# Patient Record
Sex: Female | Born: 1965 | Race: White | Hispanic: No | Marital: Married | Smoking: Never smoker
Health system: Southern US, Community
[De-identification: ages and names within clinical notes are randomized; demographics above are authoritative.]

## PROBLEM LIST (undated history)

## (undated) DIAGNOSIS — M549 Dorsalgia, unspecified: Secondary | ICD-10-CM

## (undated) DIAGNOSIS — F319 Bipolar disorder, unspecified: Secondary | ICD-10-CM

## (undated) DIAGNOSIS — J45909 Unspecified asthma, uncomplicated: Secondary | ICD-10-CM

## (undated) DIAGNOSIS — E119 Type 2 diabetes mellitus without complications: Secondary | ICD-10-CM

## (undated) HISTORY — PX: KNEE SURGERY: SHX244

---

## 2001-04-09 ENCOUNTER — Other Ambulatory Visit: Admission: RE | Admit: 2001-04-09 | Discharge: 2001-04-09 | Payer: Self-pay | Admitting: Obstetrics and Gynecology

## 2002-04-12 ENCOUNTER — Other Ambulatory Visit: Admission: RE | Admit: 2002-04-12 | Discharge: 2002-04-12 | Payer: Self-pay | Admitting: Obstetrics and Gynecology

## 2003-05-08 ENCOUNTER — Other Ambulatory Visit: Admission: RE | Admit: 2003-05-08 | Discharge: 2003-05-08 | Payer: Self-pay | Admitting: Obstetrics and Gynecology

## 2003-08-30 ENCOUNTER — Emergency Department (HOSPITAL_COMMUNITY): Admission: EM | Admit: 2003-08-30 | Discharge: 2003-08-30 | Payer: Self-pay | Admitting: Emergency Medicine

## 2003-08-30 ENCOUNTER — Emergency Department (HOSPITAL_COMMUNITY): Admission: EM | Admit: 2003-08-30 | Discharge: 2003-08-30 | Payer: Self-pay | Admitting: *Deleted

## 2004-03-26 ENCOUNTER — Emergency Department (HOSPITAL_COMMUNITY): Admission: EM | Admit: 2004-03-26 | Discharge: 2004-03-26 | Payer: Self-pay | Admitting: Emergency Medicine

## 2004-03-26 ENCOUNTER — Ambulatory Visit (HOSPITAL_COMMUNITY): Admission: RE | Admit: 2004-03-26 | Discharge: 2004-03-26 | Payer: Self-pay | Admitting: Internal Medicine

## 2004-05-10 ENCOUNTER — Other Ambulatory Visit: Admission: RE | Admit: 2004-05-10 | Discharge: 2004-05-10 | Payer: Self-pay | Admitting: Obstetrics and Gynecology

## 2004-12-10 ENCOUNTER — Emergency Department (HOSPITAL_COMMUNITY): Admission: EM | Admit: 2004-12-10 | Discharge: 2004-12-11 | Payer: Self-pay | Admitting: Emergency Medicine

## 2004-12-11 ENCOUNTER — Ambulatory Visit: Payer: Self-pay | Admitting: Psychiatry

## 2004-12-11 ENCOUNTER — Inpatient Hospital Stay (HOSPITAL_COMMUNITY): Admission: RE | Admit: 2004-12-11 | Discharge: 2004-12-12 | Payer: Self-pay | Admitting: Psychiatry

## 2004-12-24 ENCOUNTER — Inpatient Hospital Stay (HOSPITAL_COMMUNITY): Admission: RE | Admit: 2004-12-24 | Discharge: 2004-12-27 | Payer: Self-pay | Admitting: Psychiatry

## 2005-01-24 ENCOUNTER — Inpatient Hospital Stay (HOSPITAL_COMMUNITY): Admission: RE | Admit: 2005-01-24 | Discharge: 2005-01-26 | Payer: Self-pay | Admitting: Psychiatry

## 2005-01-24 ENCOUNTER — Ambulatory Visit: Payer: Self-pay | Admitting: Psychiatry

## 2005-03-16 ENCOUNTER — Emergency Department (HOSPITAL_COMMUNITY): Admission: EM | Admit: 2005-03-16 | Discharge: 2005-03-16 | Payer: Self-pay | Admitting: *Deleted

## 2005-03-25 ENCOUNTER — Emergency Department (HOSPITAL_COMMUNITY): Admission: EM | Admit: 2005-03-25 | Discharge: 2005-03-26 | Payer: Self-pay | Admitting: Emergency Medicine

## 2005-03-25 ENCOUNTER — Inpatient Hospital Stay (HOSPITAL_COMMUNITY): Admission: EM | Admit: 2005-03-25 | Discharge: 2005-03-27 | Payer: Self-pay | Admitting: Psychiatry

## 2005-03-26 ENCOUNTER — Ambulatory Visit: Payer: Self-pay | Admitting: Psychiatry

## 2005-05-11 ENCOUNTER — Emergency Department (HOSPITAL_COMMUNITY): Admission: EM | Admit: 2005-05-11 | Discharge: 2005-05-12 | Payer: Self-pay | Admitting: Emergency Medicine

## 2005-05-25 ENCOUNTER — Emergency Department (HOSPITAL_COMMUNITY): Admission: EM | Admit: 2005-05-25 | Discharge: 2005-05-25 | Payer: Self-pay | Admitting: Emergency Medicine

## 2005-06-21 ENCOUNTER — Emergency Department (HOSPITAL_COMMUNITY): Admission: EM | Admit: 2005-06-21 | Discharge: 2005-06-21 | Payer: Self-pay | Admitting: Emergency Medicine

## 2005-07-05 ENCOUNTER — Emergency Department (HOSPITAL_COMMUNITY): Admission: EM | Admit: 2005-07-05 | Discharge: 2005-07-05 | Payer: Self-pay | Admitting: Emergency Medicine

## 2005-07-18 ENCOUNTER — Emergency Department (HOSPITAL_COMMUNITY): Admission: EM | Admit: 2005-07-18 | Discharge: 2005-07-19 | Payer: Self-pay | Admitting: Emergency Medicine

## 2005-08-10 ENCOUNTER — Emergency Department (HOSPITAL_COMMUNITY): Admission: EM | Admit: 2005-08-10 | Discharge: 2005-08-10 | Payer: Self-pay | Admitting: Emergency Medicine

## 2005-09-07 ENCOUNTER — Emergency Department (HOSPITAL_COMMUNITY): Admission: EM | Admit: 2005-09-07 | Discharge: 2005-09-07 | Payer: Self-pay | Admitting: Emergency Medicine

## 2005-11-11 ENCOUNTER — Emergency Department (HOSPITAL_COMMUNITY): Admission: EM | Admit: 2005-11-11 | Discharge: 2005-11-12 | Payer: Self-pay | Admitting: Emergency Medicine

## 2005-11-12 ENCOUNTER — Ambulatory Visit: Payer: Self-pay | Admitting: Psychiatry

## 2005-11-12 ENCOUNTER — Inpatient Hospital Stay (HOSPITAL_COMMUNITY): Admission: EM | Admit: 2005-11-12 | Discharge: 2005-11-12 | Payer: Self-pay | Admitting: Psychiatry

## 2005-12-23 ENCOUNTER — Ambulatory Visit: Payer: Self-pay | Admitting: *Deleted

## 2005-12-23 ENCOUNTER — Emergency Department (HOSPITAL_COMMUNITY): Admission: EM | Admit: 2005-12-23 | Discharge: 2005-12-24 | Payer: Self-pay | Admitting: Emergency Medicine

## 2005-12-24 ENCOUNTER — Inpatient Hospital Stay (HOSPITAL_COMMUNITY): Admission: EM | Admit: 2005-12-24 | Discharge: 2005-12-27 | Payer: Self-pay | Admitting: *Deleted

## 2006-02-18 ENCOUNTER — Emergency Department (HOSPITAL_COMMUNITY): Admission: EM | Admit: 2006-02-18 | Discharge: 2006-02-18 | Payer: Self-pay | Admitting: Emergency Medicine

## 2011-02-17 ENCOUNTER — Emergency Department (HOSPITAL_COMMUNITY)
Admission: EM | Admit: 2011-02-17 | Discharge: 2011-02-17 | Disposition: A | Discharge status: Home / Self Care | Payer: Medicaid Other | Attending: Emergency Medicine | Admitting: Emergency Medicine

## 2011-02-17 DIAGNOSIS — M5430 Sciatica, unspecified side: Secondary | ICD-10-CM | POA: Insufficient documentation

## 2011-02-17 DIAGNOSIS — M545 Low back pain, unspecified: Secondary | ICD-10-CM | POA: Insufficient documentation

## 2012-02-28 ENCOUNTER — Emergency Department (HOSPITAL_COMMUNITY)
Admission: EM | Admit: 2012-02-28 | Discharge: 2012-02-28 | Disposition: A | Discharge status: Home / Self Care | Payer: Worker's Compensation | Attending: Emergency Medicine | Admitting: Emergency Medicine

## 2012-02-28 ENCOUNTER — Encounter (HOSPITAL_COMMUNITY): Payer: Self-pay

## 2012-02-28 DIAGNOSIS — Y9389 Activity, other specified: Secondary | ICD-10-CM | POA: Insufficient documentation

## 2012-02-28 DIAGNOSIS — Y9229 Other specified public building as the place of occurrence of the external cause: Secondary | ICD-10-CM | POA: Insufficient documentation

## 2012-02-28 DIAGNOSIS — Z9889 Other specified postprocedural states: Secondary | ICD-10-CM | POA: Insufficient documentation

## 2012-02-28 DIAGNOSIS — J45909 Unspecified asthma, uncomplicated: Secondary | ICD-10-CM | POA: Insufficient documentation

## 2012-02-28 DIAGNOSIS — IMO0002 Reserved for concepts with insufficient information to code with codable children: Secondary | ICD-10-CM | POA: Insufficient documentation

## 2012-02-28 DIAGNOSIS — M545 Low back pain, unspecified: Secondary | ICD-10-CM | POA: Insufficient documentation

## 2012-02-28 DIAGNOSIS — Y990 Civilian activity done for income or pay: Secondary | ICD-10-CM | POA: Insufficient documentation

## 2012-02-28 DIAGNOSIS — W108XXA Fall (on) (from) other stairs and steps, initial encounter: Secondary | ICD-10-CM | POA: Insufficient documentation

## 2012-02-28 DIAGNOSIS — F319 Bipolar disorder, unspecified: Secondary | ICD-10-CM | POA: Insufficient documentation

## 2012-02-28 HISTORY — DX: Dorsalgia, unspecified: M54.9

## 2012-02-28 HISTORY — DX: Unspecified asthma, uncomplicated: J45.909

## 2012-02-28 HISTORY — DX: Bipolar disorder, unspecified: F31.9

## 2012-02-28 MED ORDER — HYDROCODONE-ACETAMINOPHEN 5-500 MG PO TABS: 1.0000 | ORAL_TABLET | Freq: Four times a day (QID) | ORAL | Status: DC | PRN

## 2012-02-28 MED ORDER — OXYCODONE-ACETAMINOPHEN 5-325 MG PO TABS
2.0000 | ORAL_TABLET | Freq: Once | ORAL | Status: AC
  Administered 2012-02-28: 2 via ORAL
  Filled 2012-02-28: qty 2

## 2012-02-28 MED ORDER — CYCLOBENZAPRINE HCL 10 MG PO TABS: 10.0000 mg | ORAL_TABLET | Freq: Two times a day (BID) | ORAL | Status: DC | PRN

## 2012-02-28 NOTE — Discharge Instructions (Signed)

## 2012-02-28 NOTE — ED Provider Notes (Signed)
History     CSN: [id number]  Arrival date & time 02/28/12  1209   First MD Initiated Contact with Patient 02/28/12 1308      Chief Complaint  Patient presents with  . Back Pain  . Fall    this am    (Consider location/radiation/quality/duration/timing/severity/associated sxs/prior treatment) HPI  46 year old female with history of chronic back pain presents complaining of back pain. Patient reports while at work she was walking up the stairs, missed step and fell forward injuring the back. Denies hitting head or LOC.  Complaining of pain to her low back that is sharp and throbbing in nature and worsened with movement, persistent, nonradiating, with moderate in severity. She denies urinary or bowel incontinence, saddle anesthesia, or rash. She denies any urinary symptoms.  She denies numbness or weakness.    Past Medical History  Diagnosis Date  . Back pain   . Asthma   . Bipolar 1 disorder     Past Surgical History  Procedure Date  . Knee surgery     No family history on file.  History  Substance Use Topics  . Smoking status: Never Smoker   . Smokeless tobacco: Not on file  . Alcohol Use: No    OB History    Grav Para Term Preterm Abortions TAB SAB Ect Mult Living                  Review of Systems  Constitutional: Negative for fever.  HENT: Negative for neck pain.   Respiratory: Negative for shortness of breath.   Cardiovascular: Negative for chest pain.  Musculoskeletal: Positive for back pain and gait problem.  Skin: Negative for rash.  Neurological: Negative for numbness and headaches.    Allergies  Aspirin and Iohexol  Home Medications   Current Outpatient Rx  Name Route Sig Dispense Refill  . GABAPENTIN 400 MG PO CAPS Oral Take 400 mg by mouth 3 (three) times daily.    Marland Kitchen LAMOTRIGINE 200 MG PO TABS Oral Take 300 mg by mouth daily. Take 1.5 tablet  (300 mg daily)    . PAROXETINE HCL 40 MG PO TABS Oral Take 40 mg by mouth every morning.    Marland Kitchen  QUETIAPINE FUMARATE 100 MG PO TABS Oral Take 100 mg by mouth at bedtime.      BP 118/68  Pulse 89  Temp 98 F (36.7 C) (Oral)  Resp 18  Ht 5\' 5"  (1.651 m)  Wt 240 lb (108.863 kg)  BMI 39.94 kg/m2  SpO2 99%  LMP 12/08/2011  Physical Exam  Nursing note and vitals reviewed. Constitutional: She appears well-developed and well-nourished.       Moderately obese  HENT:  Head: Normocephalic and atraumatic.  Eyes: Conjunctivae normal are normal.  Neck: Normal range of motion. Neck supple.  Abdominal: Soft. There is no tenderness.       No CVA tenderness  Musculoskeletal: She exhibits tenderness (Para lumbar tenderness on palpation without midline tenderness or step-off noted. Increasing pain with back flexion, hypertension, and lateral rotation. Normal hip flexion and extension bilaterally.). She exhibits no edema.  Neurological: She has normal reflexes.  Skin: Skin is warm. No rash noted.  Psychiatric: She has a normal mood and affect.    ED Course  Procedures (including critical care time)  Labs Reviewed - No data to display No results found.   No diagnosis found.  1. Lower back strain  MDM  Low back pain secondary to fall. Patient able to ambulate  with a cane. No radicular pain. No red flags. X-rays not indicated at this time and patient declined.  Treatment including pain meds/ muscle relaxant/ given.  RICE therapy discussed.  Ortho referral as needed.    BP 118/68  Pulse 89  Temp 98 F (36.7 C) (Oral)  Resp 18  Ht 5\' 5"  (1.651 m)  Wt 240 lb (108.863 kg)  BMI 39.94 kg/m2  SpO2 99%  LMP 12/08/2011         Fayrene Helper, PA-C 02/28/12 1333

## 2012-02-28 NOTE — ED Notes (Signed)
Pt presents with NAD- Pt hx of back pain- reports she fell this am. Walking up steps- caught herself- Pt reports lower back.

## 2012-02-29 NOTE — ED Provider Notes (Signed)
Medical screening examination/treatment/procedure(s) were performed by non-physician practitioner and as supervising physician I was immediately available for consultation/collaboration.   Jakira Mcfadden M Heer Justiss, DO 02/29/12 2028 

## 2013-06-11 ENCOUNTER — Ambulatory Visit: Payer: Self-pay

## 2013-07-09 ENCOUNTER — Ambulatory Visit: Payer: Medicaid Other

## 2013-07-16 ENCOUNTER — Ambulatory Visit: Payer: Self-pay

## 2013-07-23 ENCOUNTER — Ambulatory Visit: Payer: Self-pay

## 2013-10-18 ENCOUNTER — Emergency Department (HOSPITAL_COMMUNITY): Payer: Medicaid Other

## 2013-10-18 ENCOUNTER — Encounter (HOSPITAL_COMMUNITY): Payer: Self-pay | Admitting: Emergency Medicine

## 2013-10-18 ENCOUNTER — Emergency Department (HOSPITAL_COMMUNITY)
Admission: EM | Admit: 2013-10-18 | Discharge: 2013-10-18 | Disposition: A | Discharge status: Home / Self Care | Payer: Medicaid Other | Attending: Emergency Medicine | Admitting: Emergency Medicine

## 2013-10-18 DIAGNOSIS — Z79899 Other long term (current) drug therapy: Secondary | ICD-10-CM | POA: Insufficient documentation

## 2013-10-18 DIAGNOSIS — Y92838 Other recreation area as the place of occurrence of the external cause: Secondary | ICD-10-CM

## 2013-10-18 DIAGNOSIS — J45909 Unspecified asthma, uncomplicated: Secondary | ICD-10-CM | POA: Insufficient documentation

## 2013-10-18 DIAGNOSIS — Y9239 Other specified sports and athletic area as the place of occurrence of the external cause: Secondary | ICD-10-CM | POA: Insufficient documentation

## 2013-10-18 DIAGNOSIS — M549 Dorsalgia, unspecified: Secondary | ICD-10-CM | POA: Insufficient documentation

## 2013-10-18 DIAGNOSIS — W219XXA Striking against or struck by unspecified sports equipment, initial encounter: Secondary | ICD-10-CM | POA: Insufficient documentation

## 2013-10-18 DIAGNOSIS — Y9369 Activity, other involving other sports and athletics played as a team or group: Secondary | ICD-10-CM | POA: Insufficient documentation

## 2013-10-18 DIAGNOSIS — S022XXA Fracture of nasal bones, initial encounter for closed fracture: Secondary | ICD-10-CM | POA: Insufficient documentation

## 2013-10-18 DIAGNOSIS — F319 Bipolar disorder, unspecified: Secondary | ICD-10-CM | POA: Insufficient documentation

## 2013-10-18 DIAGNOSIS — E119 Type 2 diabetes mellitus without complications: Secondary | ICD-10-CM | POA: Insufficient documentation

## 2013-10-18 HISTORY — DX: Type 2 diabetes mellitus without complications: E11.9

## 2013-10-18 LAB — HCG, SERUM, QUALITATIVE: Preg, Serum: NEGATIVE

## 2013-10-18 MED ORDER — HYDROCODONE-ACETAMINOPHEN 5-325 MG PO TABS: 1.0000 | ORAL_TABLET | Freq: Four times a day (QID) | ORAL | Status: AC | PRN

## 2013-10-18 MED ORDER — CEPHALEXIN 500 MG PO CAPS: 500.0000 mg | ORAL_CAPSULE | Freq: Four times a day (QID) | ORAL | Status: DC

## 2013-10-18 MED ORDER — HYDROCODONE-ACETAMINOPHEN 5-325 MG PO TABS
2.0000 | ORAL_TABLET | Freq: Once | ORAL | Status: AC
  Administered 2013-10-18: 2 via ORAL
  Filled 2013-10-18: qty 2

## 2013-10-18 NOTE — ED Provider Notes (Signed)
CSN: [id number]     Arrival date & time 10/18/13  2015 History   None   This chart was scribed for non-physician practitioner, Santiago GladHeather Sharlot Sturkey, PA, working with Dagmar HaitWilliam Blair Walden, MD by Marica OtterNusrat Rahman, ED Scribe. This patient was seen in room TR07C/TR07C and the patient's care was started at 10:39 PM.  Chief Complaint  Patient presents with  . Facial Injury   HPI HPI Comments: Patricia Harding is a 48 y.o. female, with a history of asthma, back pain, bipolar disorder, and DM, who presents to the Emergency Department complaining of facial injury sustained approximately 2 hours ago. Pt reports that she was struck in the nose by a batted softball. Pt complains of laceration across the bridge of her nose and associated bleeding on the laceration site and from the inside of the nose. Pt also complains of pain to the bridge of her nose and underneath her eyes. Pt rates her facial pain a 9 out of 10. Pt denies dizziness, change of vision, loc, difficulty breathing through the nose. Pt denies taking any measures for her Sx prior to arrival to the ED.  She is currently not on any anticoagulants.  Past Medical History  Diagnosis Date  . Back pain   . Asthma   . Bipolar 1 disorder   . Diabetes mellitus without complication    Past Surgical History  Procedure Laterality Date  . Knee surgery     No family history on file. History  Substance Use Topics  . Smoking status: Never Smoker   . Smokeless tobacco: Not on file  . Alcohol Use: No   OB History   Grav Para Term Preterm Abortions TAB SAB Ect Mult Living                 Review of Systems  HENT: Positive for nosebleeds (following injury to nose with a softball ).   Eyes: Negative for visual disturbance.  Skin: Positive for wound (laceration across the bridge of her nose).  Neurological: Negative for dizziness.   Allergies  Aspirin and Iohexol  Home Medications   Prior to Admission medications   Medication Sig Start Date End Date  Taking? Authorizing Provider  gabapentin (NEURONTIN) 400 MG capsule Take 400 mg by mouth 3 (three) times daily.   Yes Historical Provider, MD  lamoTRIgine (LAMICTAL) 200 MG tablet Take 300 mg by mouth daily. Take 1.5 tablet  (300 mg daily)   Yes Historical Provider, MD  PARoxetine (PAXIL) 40 MG tablet Take 40 mg by mouth every morning.   Yes Historical Provider, MD  QUEtiapine (SEROQUEL) 100 MG tablet Take 100 mg by mouth at bedtime.   Yes Historical Provider, MD   Triage Vitals: BP 142/78  Pulse 102  Temp(Src) 98.6 F (37 C)  Resp 18  Ht 5\' 4"  (1.626 m)  Wt 228 lb (103.42 kg)  BMI 39.12 kg/m2  LMP 08/18/2013 Physical Exam  Nursing note and vitals reviewed. Constitutional: She is oriented to person, place, and time. She appears well-developed and well-nourished. No distress.  HENT:  Head: Normocephalic. Head is without raccoon's eyes and without Battle's sign.  both nares patent  Eyes: Conjunctivae and EOM are normal. Pupils are equal, round, and reactive to light.  Neck: Normal range of motion. No tracheal deviation present.  Cardiovascular: Normal rate, regular rhythm and normal heart sounds.   Pulmonary/Chest: Effort normal and breath sounds normal. No respiratory distress.  Musculoskeletal: Normal range of motion.  Neurological: She is alert and oriented  to person, place, and time. No cranial nerve deficit.  Skin: Skin is warm and dry.  1cm superficial laceration to the bridge of the nose, non-gaping, mild swelling over bridge of nose.   Psychiatric: She has a normal mood and affect. Her behavior is normal.    ED Course  Procedures (including critical care time)  COORDINATION OF CARE: 10:43 PM-Discussed treatment plan which includes meds (including antibiotics), imaging, labs and icing the area with pt at bedside. Patient verbalizes understanding and agrees with treatment plan.  Labs Review Labs Reviewed  HCG, SERUM, QUALITATIVE    Imaging Review Ct Maxillofacial Wo  Cm  10/18/2013   CLINICAL DATA:  Softball injury. Nose pain and pain below the orbits bilaterally.  EXAM: CT MAXILLOFACIAL WITHOUT CONTRAST  TECHNIQUE: Multidetector CT imaging of the maxillofacial structures was performed. Multiplanar CT image reconstructions were also generated. A small metallic BB was placed on the right temple in order to reliably differentiate right from left.  COMPARISON:  CT head 08/30/2003.  FINDINGS: Minimally displaced bilateral nasal bone fractures are evident. The maxilla is otherwise intact. Leftward nasal septal deviation measures 4 mm from the midline. The nasal cavity is clear. Mild circumferential mucosal thickening is present in the maxillary sinuses bilaterally. There is moderate mucosal thickening through the ostiomeatal complex bilaterally. Scattered opacification of ethmoid air cells is noted bilaterally. The frontal sinuses and sphenoid sinuses are clear. The mastoid air cells are clear.  IMPRESSION: 1. Minimally displaced bilateral nasal bone fractures. 2. No additional fractures. 3. Mild maxillary and ethmoid sinus disease.   Electronically Signed   By: Gennette Pachris  Mattern M.D.   On: 10/18/2013 21:03     EKG Interpretation None      MDM   Final diagnoses:  None   Patient presenting after being hit by a softball in the nose.  No LOC.  No nausea, vomiting, vision changes.  Normal neurological exam.  CT maxillofacial showing minimally displaced bilateral nasal bone fracture.  Fracture minimally displaced.  Nares patent.  Feel that the patient is stable for discharge.  Patient discharged home with pain medication.  Return precautions given.   Santiago GladHeather Khi Mcmillen, PA-C 10/18/13 2350

## 2013-10-18 NOTE — ED Notes (Signed)
The pt was struck in the nose by a batted softball 2 hours ago.  No loc.  Lac across the bridge of her nose and both the laceration and the inside of her nose bled.  No bleeding now

## 2013-10-18 NOTE — ED Notes (Signed)
Pt st's she was playing softball tonight and a hit ball hit her in the nose.  Bruising present to bridge of nose.  Bleeding controlled at this time

## 2013-10-18 NOTE — Discharge Instructions (Signed)
Take pain medication as prescribed for pain.  Do not drive or operate heavy machinery for 4 hours after taking pain medication.

## 2013-10-18 NOTE — ED Notes (Signed)
Ice pack given

## 2013-10-19 NOTE — ED Provider Notes (Signed)
Medical screening examination/treatment/procedure(s) were performed by non-physician practitioner and as supervising physician I was immediately available for consultation/collaboration.   EKG Interpretation None        William Satoru Milich, MD 10/19/13 0026 

## 2014-01-20 ENCOUNTER — Emergency Department (HOSPITAL_COMMUNITY): Payer: Medicaid Other

## 2014-01-20 ENCOUNTER — Encounter (HOSPITAL_COMMUNITY): Payer: Self-pay | Admitting: Emergency Medicine

## 2014-01-20 ENCOUNTER — Emergency Department (HOSPITAL_COMMUNITY)
Admission: EM | Admit: 2014-01-20 | Discharge: 2014-01-20 | Disposition: A | Discharge status: Home / Self Care | Payer: Medicaid Other | Attending: Emergency Medicine | Admitting: Emergency Medicine

## 2014-01-20 DIAGNOSIS — R05 Cough: Secondary | ICD-10-CM | POA: Insufficient documentation

## 2014-01-20 DIAGNOSIS — R059 Cough, unspecified: Secondary | ICD-10-CM | POA: Insufficient documentation

## 2014-01-20 DIAGNOSIS — R079 Chest pain, unspecified: Secondary | ICD-10-CM | POA: Insufficient documentation

## 2014-01-20 DIAGNOSIS — J069 Acute upper respiratory infection, unspecified: Secondary | ICD-10-CM | POA: Insufficient documentation

## 2014-01-20 DIAGNOSIS — Z79899 Other long term (current) drug therapy: Secondary | ICD-10-CM | POA: Insufficient documentation

## 2014-01-20 DIAGNOSIS — Z792 Long term (current) use of antibiotics: Secondary | ICD-10-CM | POA: Insufficient documentation

## 2014-01-20 DIAGNOSIS — R51 Headache: Secondary | ICD-10-CM | POA: Insufficient documentation

## 2014-01-20 DIAGNOSIS — J45909 Unspecified asthma, uncomplicated: Secondary | ICD-10-CM | POA: Insufficient documentation

## 2014-01-20 DIAGNOSIS — F319 Bipolar disorder, unspecified: Secondary | ICD-10-CM | POA: Insufficient documentation

## 2014-01-20 DIAGNOSIS — E119 Type 2 diabetes mellitus without complications: Secondary | ICD-10-CM | POA: Insufficient documentation

## 2014-01-20 MED ORDER — GUAIFENESIN ER 600 MG PO TB12: 600.0000 mg | ORAL_TABLET | Freq: Two times a day (BID) | ORAL | Status: DC

## 2014-01-20 MED ORDER — FLUTICASONE PROPIONATE 50 MCG/ACT NA SUSP: 2.0000 | Freq: Every day | NASAL | Status: AC

## 2014-01-20 NOTE — ED Notes (Signed)
Per pt sts sts cough, congestion for 4 days. Denies using inhaler. Pt breathing WNL. No distress.

## 2014-01-20 NOTE — ED Provider Notes (Signed)
CSN: [id number]     Arrival date & time 01/20/14  1004 History  This chart was scribed for non-physician practitioner, [name], PA-C,working with Gerhard Munch, MD, by Karle Plumber, ED Scribe. This patient was seen in room TR10C/TR10C and the patient's care was started at 11:13 AM.  Chief Complaint  Patient presents with  . URI   Patient is a 48 y.o. female presenting with URI. The history is provided by the patient. No language interpreter was used.  URI Presenting symptoms: congestion and cough   Associated symptoms: headaches    HPI Comments:  Patricia Harding is a 48 y.o. female who presents to the Emergency Department complaining of moderate productive cough of green mucus that started four days ago. She reports associated HA, nasal congestion and chest wall tenderness secondary to coughing. She has taken NyQuil that gave moderate improvement of the cough. She denies nausea, vomiting or diarrhea.  Past Medical History  Diagnosis Date  . Back pain   . Asthma   . Bipolar 1 disorder   . Diabetes mellitus without complication    Past Surgical History  Procedure Laterality Date  . Knee surgery     History reviewed. No pertinent family history. History  Substance Use Topics  . Smoking status: Never Smoker   . Smokeless tobacco: Not on file  . Alcohol Use: No   OB History   Grav Para Term Preterm Abortions TAB SAB Ect Mult Living                 Review of Systems  HENT: Positive for congestion.   Respiratory: Positive for cough.   Cardiovascular: Positive for chest pain (secondary to cough).  Gastrointestinal: Negative for nausea, vomiting and diarrhea.  Neurological: Positive for headaches.    Allergies  Aspirin and Iohexol  Home Medications   Prior to Admission medications   Medication Sig Start Date End Date Taking? Authorizing Provider  cephALEXin (KEFLEX) 500 MG capsule Take 1 capsule (500 mg total) by mouth 4 (four) times daily. 10/18/13   Heather  Laisure, PA-C  fluticasone (FLONASE) 50 MCG/ACT nasal spray Place 2 sprays into both nostrils daily. 01/20/14   Trevor Mace, PA-C  gabapentin (NEURONTIN) 400 MG capsule Take 400 mg by mouth 3 (three) times daily.    Historical Provider, MD  guaiFENesin (MUCINEX) 600 MG 12 hr tablet Take 1 tablet (600 mg total) by mouth 2 (two) times daily. 01/20/14   Trevor Mace, PA-C  HYDROcodone-acetaminophen (NORCO/VICODIN) 5-325 MG per tablet Take 1-2 tablets by mouth every 6 (six) hours as needed. 10/18/13   Heather Laisure, PA-C  lamoTRIgine (LAMICTAL) 200 MG tablet Take 300 mg by mouth daily. Take 1.5 tablet  (300 mg daily)    Historical Provider, MD  PARoxetine (PAXIL) 40 MG tablet Take 40 mg by mouth every morning.    Historical Provider, MD  QUEtiapine (SEROQUEL) 100 MG tablet Take 100 mg by mouth at bedtime.    Historical Provider, MD   Triage Vitals: BP 144/91  Pulse 88  Temp(Src) 98.1 F (36.7 C)  Resp 18  SpO2 100% Physical Exam  Nursing note and vitals reviewed. Constitutional: She is oriented to person, place, and time. She appears well-developed and well-nourished. No distress.  HENT:  Head: Normocephalic and atraumatic.  Nose: Mucosal edema present.  Mouth/Throat: Oropharynx is clear and moist.  Post nasal drip.  Eyes: Conjunctivae and EOM are normal.  Neck: Normal range of motion. Neck supple.  Cardiovascular: Normal rate, regular  rhythm and normal heart sounds.   Pulmonary/Chest: Effort normal and breath sounds normal. No respiratory distress. She has no wheezes. She has no rales.  Musculoskeletal: Normal range of motion. She exhibits no edema.  Neurological: She is alert and oriented to person, place, and time. No sensory deficit.  Skin: Skin is warm and dry.  Psychiatric: She has a normal mood and affect. Her behavior is normal.    ED Course  Procedures (including critical care time) DIAGNOSTIC STUDIES: Oxygen Saturation is 100% on RA, normal by my interpretation.    COORDINATION OF CARE: 11:15 AM- Will prescribe nasal spray and Mucinex. Pt verbalizes understanding and agrees to plan.  Medications - No data to display  Labs Review Labs Reviewed - No data to display  Imaging Review Dg Chest 2 View  01/20/2014   CLINICAL DATA:  Cough for 4 days  EXAM: CHEST  2 VIEW  COMPARISON:  02/18/2006  FINDINGS: The heart size and mediastinal contours are within normal limits. Both lungs are clear. The visualized skeletal structures are unremarkable.  IMPRESSION: No active cardiopulmonary disease.   Electronically Signed   By: Christiana Pellant M.D.   On: 01/20/2014 11:21     EKG Interpretation None      MDM   Final diagnoses:  URI (upper respiratory infection)    Patient presenting with URI symptoms. She is well appearing and in no apparent distress. Afebrile, vital signs stable. Lungs clear. Mucosal edema and post nasal drip on exam. Symptoms present for less than one week. I do not feel antibiotics are appropriate at this time. Discussed symptomatic treatment.  Chest x-ray obtained prior to patient being seen, negative. Stable for discharge. Return precautions given. Patient states understanding of treatment care plan and is agreeable.  I personally performed the services described in this documentation, which was scribed in my presence. The recorded information has been reviewed and is accurate.    Trevor Mace, PA-C 01/20/14 1125  Trevor Mace, PA-C 01/20/14 1125

## 2014-01-20 NOTE — Discharge Instructions (Signed)
Use nasal spray as directed. Take Mucinex as prescribed.  Upper Respiratory Infection, Adult An upper respiratory infection (URI) is also sometimes known as the common cold. The upper respiratory tract includes the nose, sinuses, throat, trachea, and bronchi. Bronchi are the airways leading to the lungs. Most people improve within 1 week, but symptoms can last up to 2 weeks. A residual cough may last even longer.  CAUSES Many different viruses can infect the tissues lining the upper respiratory tract. The tissues become irritated and inflamed and often become very moist. Mucus production is also common. A cold is contagious. You can easily spread the virus to others by oral contact. This includes kissing, sharing a glass, coughing, or sneezing. Touching your mouth or nose and then touching a surface, which is then touched by another person, can also spread the virus. SYMPTOMS  Symptoms typically develop 1 to 3 days after you come in contact with a cold virus. Symptoms vary from person to person. They may include:  Runny nose.  Sneezing.  Nasal congestion.  Sinus irritation.  Sore throat.  Loss of voice (laryngitis).  Cough.  Fatigue.  Muscle aches.  Loss of appetite.  Headache.  Low-grade fever. DIAGNOSIS  You might diagnose your own cold based on familiar symptoms, since most people get a cold 2 to 3 times a year. Your caregiver can confirm this based on your exam. Most importantly, your caregiver can check that your symptoms are not due to another disease such as strep throat, sinusitis, pneumonia, asthma, or epiglottitis. Blood tests, throat tests, and X-rays are not necessary to diagnose a common cold, but they may sometimes be helpful in excluding other more serious diseases. Your caregiver will decide if any further tests are required. RISKS AND COMPLICATIONS  You may be at risk for a more severe case of the common cold if you smoke cigarettes, have chronic heart disease  (such as heart failure) or lung disease (such as asthma), or if you have a weakened immune system. The very young and very old are also at risk for more serious infections. Bacterial sinusitis, middle ear infections, and bacterial pneumonia can complicate the common cold. The common cold can worsen asthma and chronic obstructive pulmonary disease (COPD). Sometimes, these complications can require emergency medical care and may be life-threatening. PREVENTION  The best way to protect against getting a cold is to practice good hygiene. Avoid oral or hand contact with people with cold symptoms. Wash your hands often if contact occurs. There is no clear evidence that vitamin C, vitamin E, echinacea, or exercise reduces the chance of developing a cold. However, it is always recommended to get plenty of rest and practice good nutrition. TREATMENT  Treatment is directed at relieving symptoms. There is no cure. Antibiotics are not effective, because the infection is caused by a virus, not by bacteria. Treatment may include:  Increased fluid intake. Sports drinks offer valuable electrolytes, sugars, and fluids.  Breathing heated mist or steam (vaporizer or shower).  Eating chicken soup or other clear broths, and maintaining good nutrition.  Getting plenty of rest.  Using gargles or lozenges for comfort.  Controlling fevers with ibuprofen or acetaminophen as directed by your caregiver.  Increasing usage of your inhaler if you have asthma. Zinc gel and zinc lozenges, taken in the first 24 hours of the common cold, can shorten the duration and lessen the severity of symptoms. Pain medicines may help with fever, muscle aches, and throat pain. A variety of non-prescription medicines  are available to treat congestion and runny nose. Your caregiver can make recommendations and may suggest nasal or lung inhalers for other symptoms.  HOME CARE INSTRUCTIONS   Only take over-the-counter or prescription medicines  for pain, discomfort, or fever as directed by your caregiver.  Use a warm mist humidifier or inhale steam from a shower to increase air moisture. This may keep secretions moist and make it easier to breathe.  Drink enough water and fluids to keep your urine clear or pale yellow.  Rest as needed.  Return to work when your temperature has returned to normal or as your caregiver advises. You may need to stay home longer to avoid infecting others. You can also use a face mask and careful hand washing to prevent spread of the virus. SEEK MEDICAL CARE IF:   After the first few days, you feel you are getting worse rather than better.  You need your caregiver's advice about medicines to control symptoms.  You develop chills, worsening shortness of breath, or brown or red sputum. These may be signs of pneumonia.  You develop yellow or brown nasal discharge or pain in the face, especially when you bend forward. These may be signs of sinusitis.  You develop a fever, swollen neck glands, pain with swallowing, or white areas in the back of your throat. These may be signs of strep throat. SEEK IMMEDIATE MEDICAL CARE IF:   You have a fever.  You develop severe or persistent headache, ear pain, sinus pain, or chest pain.  You develop wheezing, a prolonged cough, cough up blood, or have a change in your usual mucus (if you have chronic lung disease).  You develop sore muscles or a stiff neck. Document Released: 10/19/2000 Document Revised: 07/18/2011 Document Reviewed: 07/31/2013 [facility] Patient Information 2015 [place], [place]. This information is not intended to replace advice given to you by your health care provider. Make sure you discuss any questions you have with your health care provider.

## 2014-01-20 NOTE — ED Provider Notes (Signed)
Medical screening examination/treatment/procedure(s) were performed by non-physician practitioner and as supervising physician I was immediately available for consultation/collaboration.  Izumi Mixon, MD 01/20/14 1607 

## 2014-09-30 ENCOUNTER — Encounter (HOSPITAL_COMMUNITY): Payer: Self-pay | Admitting: *Deleted

## 2014-09-30 ENCOUNTER — Emergency Department (INDEPENDENT_AMBULATORY_CARE_PROVIDER_SITE_OTHER)
Admission: EM | Admit: 2014-09-30 | Discharge: 2014-09-30 | Disposition: A | Discharge status: Home / Self Care | Payer: Self-pay | Source: Home / Self Care | Attending: Family Medicine | Admitting: Family Medicine

## 2014-09-30 DIAGNOSIS — S76311A Strain of muscle, fascia and tendon of the posterior muscle group at thigh level, right thigh, initial encounter: Secondary | ICD-10-CM

## 2014-09-30 MED ORDER — DICLOFENAC SODIUM 1 % TD GEL: 4.0000 g | Freq: Four times a day (QID) | TRANSDERMAL | Status: AC

## 2014-09-30 NOTE — Discharge Instructions (Signed)
Wear your brace for comfort, use the medicine and heat as needed, see your orthopedist if further problems.

## 2014-09-30 NOTE — ED Notes (Signed)
Pt reports  She  Twisted    Her       r knee            Yesterday       She  Reports  She  Stretched  It   And  It  Became            painfull  And  Swollen     She  Has  A  Brace  In  Place  On  That     affeted   Knee       She   Has  Pain  On  Weight  Bearing

## 2014-09-30 NOTE — ED Provider Notes (Signed)
CSN: [id number]     Arrival date & time 09/30/14  1150 History   First MD Initiated Contact with Patient 09/30/14 1452     Chief Complaint  Patient presents with  . Knee Pain   (Consider location/radiation/quality/duration/timing/severity/associated sxs/prior Treatment) Patient is a 49 y.o. female presenting with knee pain. The history is provided by the patient.  Knee Pain Location:  Knee Time since incident:  1 day Injury: no   Knee location:  R knee Pain details:    Quality:  Sharp   Severity:  Mild   Progression:  Improving Chronicity:  New (turned foot to change direction and felt pain in lat aspect of knee post.) Dislocation: no   Prior injury to area:  Yes Associated symptoms: no back pain, no decreased ROM, no numbness, no swelling and no tingling     Past Medical History  Diagnosis Date  . Back pain   . Asthma   . Bipolar 1 disorder   . Diabetes mellitus without complication    Past Surgical History  Procedure Laterality Date  . Knee surgery     History reviewed. No pertinent family history. History  Substance Use Topics  . Smoking status: Never Smoker   . Smokeless tobacco: Not on file  . Alcohol Use: No   OB History    No data available     Review of Systems  Constitutional: Negative.   Musculoskeletal: Positive for gait problem. Negative for back pain and joint swelling.  Skin: Negative.     Allergies  Aspirin and Iohexol  Home Medications   Prior to Admission medications   Medication Sig Start Date End Date Taking? Authorizing Provider  cephALEXin (KEFLEX) 500 MG capsule Take 1 capsule (500 mg total) by mouth 4 (four) times daily. 10/18/13   Heather Laisure, PA-C  diclofenac sodium (VOLTAREN) 1 % GEL Apply 4 g topically 4 (four) times daily. Please instruct in dosing. 09/30/14   Linna HoffJames D Grahm Etsitty, MD  fluticasone [facility](FLONASE) 50 MCG/ACT nasal spray Place 2 sprays into both nostrils daily. 01/20/14   Robyn M Hess, PA-C  gabapentin (NEURONTIN) 400 MG  capsule Take 400 mg by mouth 3 (three) times daily.    Historical Provider, MD  guaiFENesin (MUCINEX) 600 MG 12 hr tablet Take 1 tablet (600 mg total) by mouth 2 (two) times daily. 01/20/14   Kathrynn Speedobyn M Hess, PA-C  HYDROcodone-acetaminophen (NORCO/VICODIN) 5-325 MG per tablet Take 1-2 tablets by mouth every 6 (six) hours as needed. 10/18/13   Heather Laisure, PA-C  lamoTRIgine (LAMICTAL) 200 MG tablet Take 300 mg by mouth daily. Take 1.5 tablet  (300 mg daily)    Historical Provider, MD  PARoxetine (PAXIL) 40 MG tablet Take 40 mg by mouth every morning.    Historical Provider, MD  QUEtiapine (SEROQUEL) 100 MG tablet Take 100 mg by mouth at bedtime.    Historical Provider, MD   LMP 09/30/2014 Physical Exam  Constitutional: She is oriented to person, place, and time. She appears well-developed and well-nourished.  Musculoskeletal: She exhibits tenderness.  No pain at present, reports post lat hamstring tendon spasm pain.  Neurological: She is alert and oriented to person, place, and time.  Skin: Skin is warm and dry.  Nursing note and vitals reviewed.   ED Course  Procedures (including critical care time) Labs Review Labs Reviewed - No data to display  Imaging Review No results found.   MDM   1. Hamstring muscle strain, right, initial encounter  Linna Hoff, MD 09/30/14 [phone]

## 2018-03-10 LAB — GLUCOSE, POCT (MANUAL RESULT ENTRY): POC Glucose: 162 mg/dl — AB (ref 70–99)

## 2019-03-27 ENCOUNTER — Other Ambulatory Visit: Payer: Self-pay

## 2019-03-27 DIAGNOSIS — Z20822 Contact with and (suspected) exposure to covid-19: Secondary | ICD-10-CM

## 2019-03-30 LAB — NOVEL CORONAVIRUS, NAA: SARS-CoV-2, NAA: NOT DETECTED

## 2019-05-01 ENCOUNTER — Ambulatory Visit (INDEPENDENT_AMBULATORY_CARE_PROVIDER_SITE_OTHER): Payer: BC Managed Care – PPO

## 2019-05-01 ENCOUNTER — Encounter: Payer: Self-pay | Admitting: Emergency Medicine

## 2019-05-01 ENCOUNTER — Other Ambulatory Visit: Payer: Self-pay

## 2019-05-01 ENCOUNTER — Ambulatory Visit
Admission: EM | Admit: 2019-05-01 | Discharge: 2019-05-01 | Disposition: A | Discharge status: Home / Self Care | Payer: BC Managed Care – PPO

## 2019-05-01 DIAGNOSIS — M79642 Pain in left hand: Secondary | ICD-10-CM | POA: Diagnosis not present

## 2019-05-01 DIAGNOSIS — W1809XA Striking against other object with subsequent fall, initial encounter: Secondary | ICD-10-CM

## 2019-05-01 DIAGNOSIS — S6992XA Unspecified injury of left wrist, hand and finger(s), initial encounter: Secondary | ICD-10-CM | POA: Diagnosis not present

## 2019-05-01 NOTE — ED Triage Notes (Signed)
Pt presents to [facility] for assessment of left hand pain after she tripped and fell onto her wood pile last night.  Patient states the hand she reached out to catch herself with got caught between two pieces of wood briefly.  C/o pain to middle and ring finger radiating down to wrist.

## 2019-05-01 NOTE — ED Notes (Signed)
Patient able to ambulate independently  

## 2019-05-01 NOTE — Discharge Instructions (Signed)

## 2019-05-01 NOTE — ED Provider Notes (Signed)
[facility]    CSN: [id number] Arrival date & time: 05/01/19  0900      History   Chief Complaint Chief Complaint  Patient presents with   Fall   Hand Injury    HPI Patricia Harding is a 53 y.o. female with history of BPD, asthma, diabetes presenting for left hand pain and swelling after tripping over a pile of wood last night around 1 AM.  Patient denies intoxication, head trauma, LOC.  Patient states that she "jammed my hand between a couple pieces of wood ".  Denies open wound, numbness.  States pain is worse in middle and ring finger.  Sometimes pain will radiate to her wrist with certain movements.   Past Medical History:  Diagnosis Date   Asthma    Back pain    Bipolar 1 disorder (HCC)    Diabetes mellitus without complication (HCC)     There are no problems to display for this patient.   Past Surgical History:  Procedure Laterality Date   KNEE SURGERY      OB History   No obstetric history on file.      Home Medications    Prior to Admission medications   Medication Sig Start Date End Date Taking? Authorizing Provider  albuterol (VENTOLIN HFA) 108 (90 Base) MCG/ACT inhaler Inhale into the lungs every 6 (six) hours as needed for wheezing or shortness of breath.   Yes [provider]  amLODipine (NORVASC) 5 MG tablet Take 5 mg by mouth daily.   Yes [provider]  ARIPiprazole (ABILIFY) 5 MG tablet Take 5 mg by mouth daily.   Yes [provider]  losartan (COZAAR) 100 MG tablet Take 100 mg by mouth daily.   Yes [provider]  metFORMIN (GLUCOPHAGE) 500 MG tablet Take by mouth 3 (three) times daily.   Yes [provider]  montelukast (SINGULAIR) 10 MG tablet Take 10 mg by mouth at bedtime.   Yes [provider]  rosuvastatin (CRESTOR) 40 MG tablet Take 40 mg by mouth daily.   Yes [provider]  diclofenac sodium (VOLTAREN) 1 % GEL Apply 4 g topically 4 (four) times daily.  Please instruct in dosing. 09/30/14   Linna Hoff, MD  EPINEPHrine 0.3 mg/0.3 mL IJ SOAJ injection Inject 0.3 mg into the muscle as needed for anaphylaxis.    [provider]  fluticasone (FLONASE) 50 MCG/ACT nasal spray Place 2 sprays into both nostrils daily. 01/20/14   Hess, Nada Boozer, PA-C  gabapentin (NEURONTIN) 400 MG capsule Take 400 mg by mouth 3 (three) times daily.    [provider]  HYDROcodone-acetaminophen (NORCO/VICODIN) 5-325 MG per tablet Take 1-2 tablets by mouth every 6 (six) hours as needed. 10/18/13   Santiago Glad, PA-C  lamoTRIgine (LAMICTAL) 200 MG tablet Take 300 mg by mouth daily. Take 1.5 tablet  (300 mg daily)    [provider]  PARoxetine (PAXIL) 40 MG tablet Take 40 mg by mouth every morning.    [provider]  QUEtiapine (SEROQUEL) 100 MG tablet Take 100 mg by mouth at bedtime.    [provider]    Family History Family History  Problem Relation Age of Onset   Hypertension Mother    Diabetes Mother    Hypertension Father    Heart disease Father     Social History Social History   Tobacco Use   Smoking status: Never Smoker   Smokeless tobacco: Never Used  Substance Use  Topics   Alcohol use: No   Drug use: No     Allergies   Aspirin and Iohexol   Review of Systems Review of Systems  Constitutional: Negative for fatigue and fever.  Respiratory: Negative for cough and shortness of breath.   Cardiovascular: Negative for chest pain and palpitations.  Musculoskeletal:       Positive for left hand pain, swelling of third and fourth digits  Neurological: Negative for weakness and numbness.     Physical Exam Triage Vital Signs ED Triage Vitals  Enc Vitals Group     BP 05/01/19 0914 134/85     Pulse Rate 05/01/19 0914 85     Resp 05/01/19 0914 18     Temp 05/01/19 0914 97.8 F (36.6 C)     Temp Source 05/01/19 0914 Temporal     SpO2 05/01/19 0914 96 %     Weight --      Height --       Head Circumference --      Peak Flow --      Pain Score 05/01/19 0916 8     Pain Loc --      Pain Edu? --      Excl. in [place]? --    No data found.  Updated Vital Signs BP 134/85 (BP Location: Left Arm)    Pulse 85    Temp 97.8 F (36.6 C) (Temporal)    Resp 18    LMP 09/30/2014    SpO2 96%   Visual Acuity Right Eye Distance:   Left Eye Distance:   Bilateral Distance:    Right Eye Near:   Left Eye Near:    Bilateral Near:     Physical Exam Constitutional:      General: She is not in acute distress. HENT:     Head: Normocephalic and atraumatic.  Eyes:     General: No scleral icterus.    Conjunctiva/sclera: Conjunctivae normal.     Pupils: Pupils are equal, round, and reactive to light.  Cardiovascular:     Rate and Rhythm: Normal rate.  Pulmonary:     Effort: Pulmonary effort is normal. No respiratory distress.  Musculoskeletal:     Comments: Left third, fourth digits with decreased ROM second to swelling which is moderate.  Patient endorsing MCP, PIP tenderness of affected digits.  Full active ROM of wrists, elbows bilaterally.  Neurovascularly intact bilaterally  Skin:    General: Skin is warm.     Capillary Refill: Capillary refill takes less than 2 seconds.     Coloration: Skin is not jaundiced.     Findings: No bruising or erythema.  Neurological:     General: No focal deficit present.     Mental Status: She is alert.      UC Treatments / Results  Labs (all labs ordered are listed, but only abnormal results are displayed) Labs Reviewed - No data to display  EKG   Radiology DG Hand Complete Left  Result Date: 05/01/2019 CLINICAL DATA:  Fall with hand pain. EXAM: LEFT HAND - COMPLETE 3+ VIEW COMPARISON:  None. FINDINGS: There is no evidence of fracture or dislocation. There is no evidence of arthropathy or other focal bone abnormality. Soft tissues are unremarkable. IMPRESSION: Negative. Electronically Signed   By: Franki Cabot M.D.   On: 05/01/2019  09:39    Procedures Procedures (including critical care time)  Medications Ordered in UC Medications - No data to display  Initial Impression / Assessment and  Plan / UC Course  I have reviewed the triage vital signs and the nursing notes.  Pertinent labs & imaging results that were available during my care of the patient were reviewed by me and considered in my medical decision making (see chart for details).     Left hand x-ray done office, reviewed by me and radiology: Negative for acute fracture, dislocation, or soft tissue swelling.  Reviewed findings with patient verbalized understanding.  Fingers were buddy taped in office, reviewed importance of rehabbing range of motion, possible follow-up with sports medicine, and RICE.  Return precautions discussed, patient verbalized understanding and is agreeable to plan. Final Clinical Impressions(s) / UC Diagnoses   Final diagnoses:  Injury of left hand, initial encounter     Discharge Instructions     Recommend RICE: rest, ice, compression, elevation as needed for pain.    Heat therapy (hot compress, warm wash red, hot showers, etc.) can help relax muscles and soothe muscle aches. Cold therapy (ice packs) can be used to help swelling both after injury and after prolonged use of areas of chronic pain/aches.  For pain: recommend 350 mg-1000 mg of Tylenol (acetaminophen) and/or 200 mg - 800 mg of Advil (ibuprofen, Motrin) every 8 hours as needed.  May alternate between the two throughout the day as they are generally safe to take together.  DO NOT exceed more than 3000 mg of Tylenol or 3200 mg of ibuprofen in a 24 hour period as this could damage your stomach, kidneys, liver, or increase your bleeding risk.    ED Prescriptions    None     I have reviewed the PDMP during this encounter.   Odette FractionHall-Potvin, [place], [place] JerseyPA-C 05/02/19 [phone]

## 2019-08-22 ENCOUNTER — Ambulatory Visit: Payer: BC Managed Care – PPO | Attending: Internal Medicine

## 2019-08-22 DIAGNOSIS — Z23 Encounter for immunization: Secondary | ICD-10-CM

## 2019-08-22 NOTE — Progress Notes (Signed)
   Covid-19 Vaccination Clinic  Name:  Patricia Harding    MRN: [id number] DOB: December 11, 1965  08/22/2019  Ms. Patricia Harding was observed post Covid-19 immunization for 15 minutes without incident. She was provided with Vaccine Information Sheet and instruction to access the V-Safe system.   Ms. Patricia Harding was instructed to call 911 with any severe reactions post vaccine: Marland Kitchen Difficulty breathing  . Swelling of face and throat  . A fast heartbeat  . A bad rash all over body  . Dizziness and weakness   Immunizations Administered    Name Date Dose VIS Date Route   Pfizer COVID-19 Vaccine 08/22/2019  8:28 AM 0.3 mL 04/19/2019 Intramuscular   Manufacturer: [employer], [employer]   Lot: [id number]   NDC: [address]-6

## 2019-09-16 ENCOUNTER — Ambulatory Visit: Payer: BC Managed Care – PPO | Attending: Internal Medicine

## 2019-09-16 DIAGNOSIS — Z23 Encounter for immunization: Secondary | ICD-10-CM

## 2019-09-16 NOTE — Progress Notes (Signed)
   Covid-19 Vaccination Clinic  Name:  ANTANIYA VENUTI    MRN: [id number] DOB: Jan 11, 1966  09/16/2019  Ms. Rebuck was observed post Covid-19 immunization for 15 minutes without incident. She was provided with Vaccine Information Sheet and instruction to access the V-Safe system.   Ms. Morris was instructed to call 911 with any severe reactions post vaccine: Marland Kitchen Difficulty breathing  . Swelling of face and throat  . A fast heartbeat  . A bad rash all over body  . Dizziness and weakness   Immunizations Administered    Name Date Dose VIS Date Route   Pfizer COVID-19 Vaccine 09/16/2019  8:20 AM 0.3 mL 07/03/2018 Intramuscular   Manufacturer: [employer], [employer]   Lot: [id number]   NDC: [address]-2

## 2021-04-14 IMAGING — DX DG HAND COMPLETE 3+V*L*
3 series · 3 of 3 positions shown · non-contrast
Comparison: None.

CLINICAL DATA: Fall with hand pain.

EXAM:
LEFT HAND - COMPLETE 3+ VIEW

[hand pa]
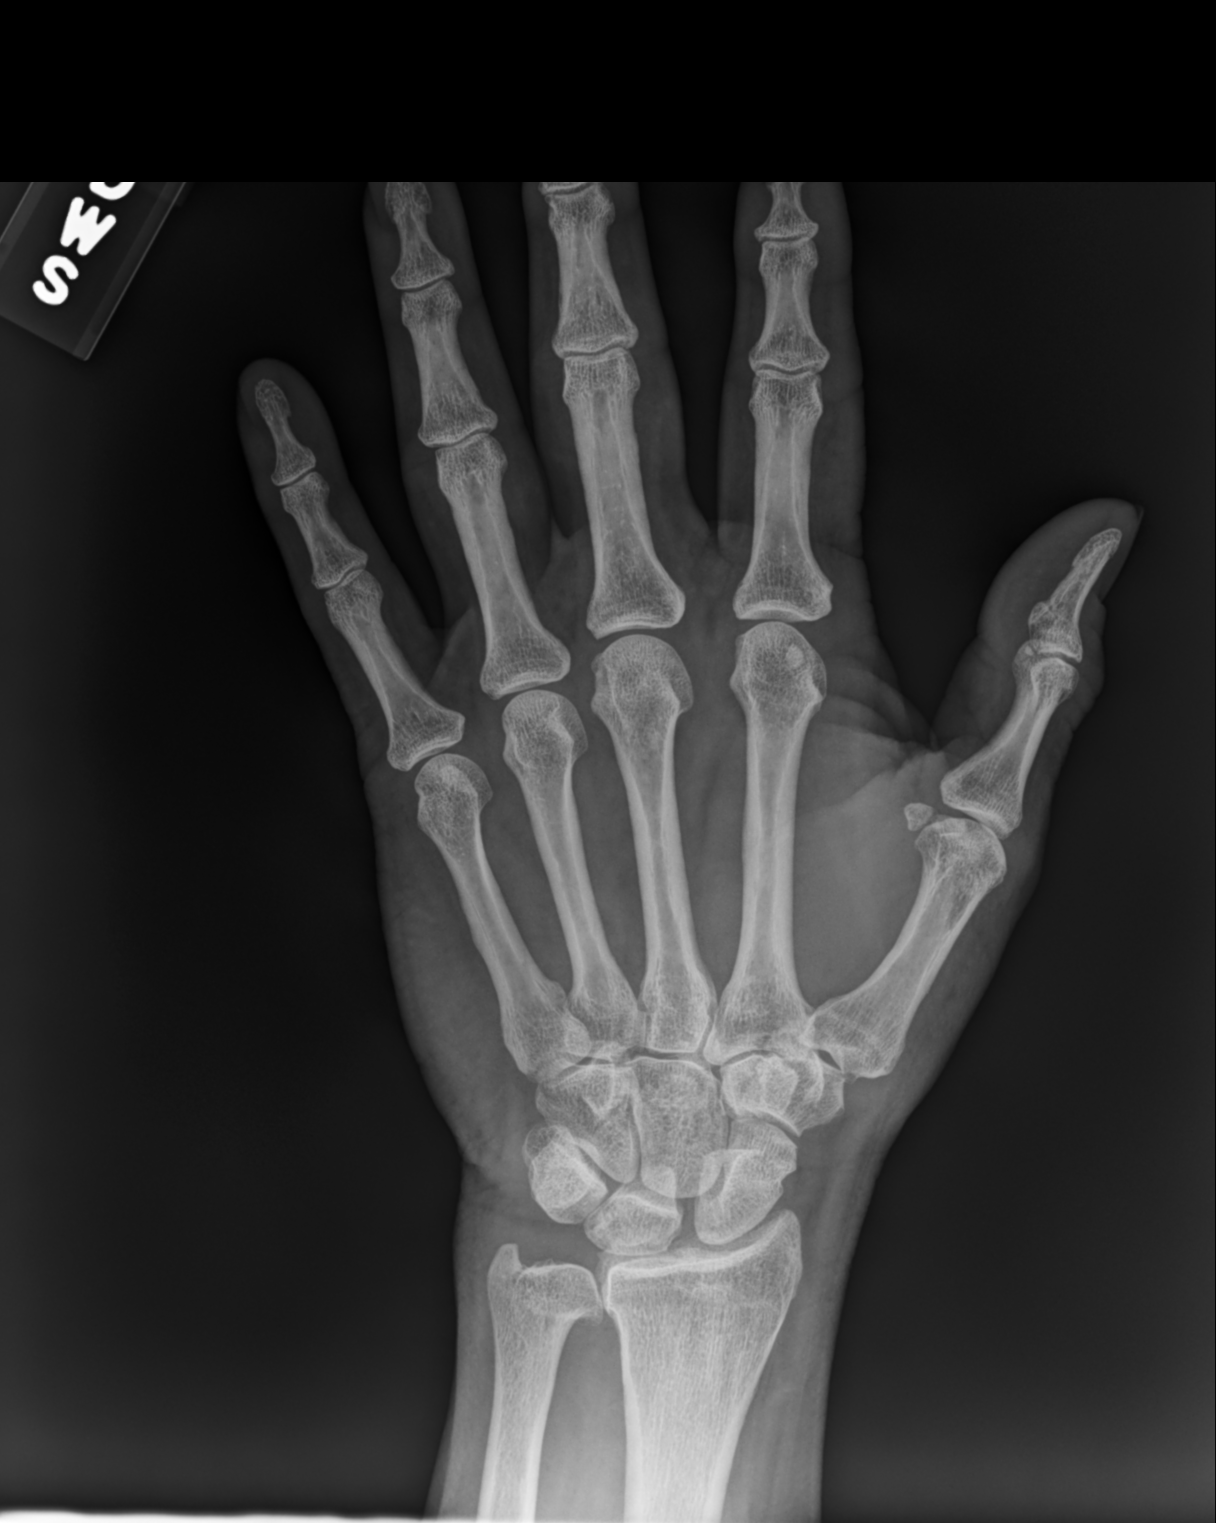

[hand mlo]
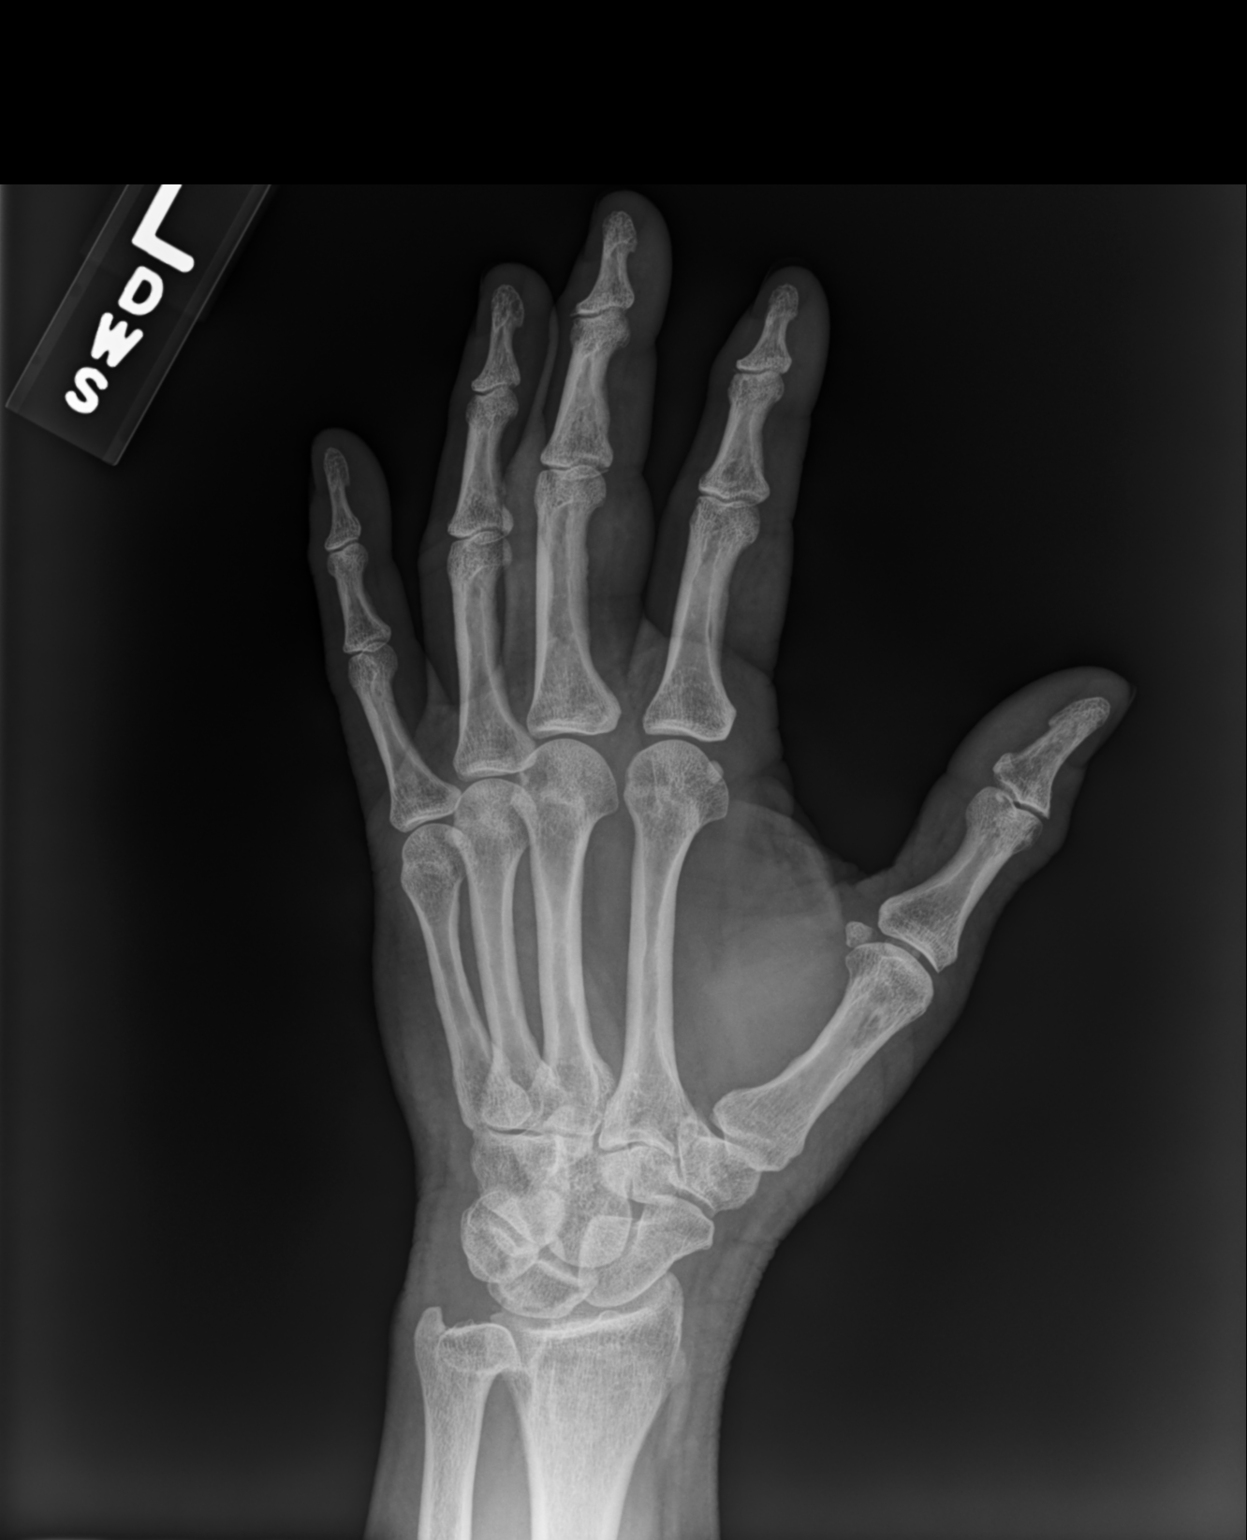

[hand lat]
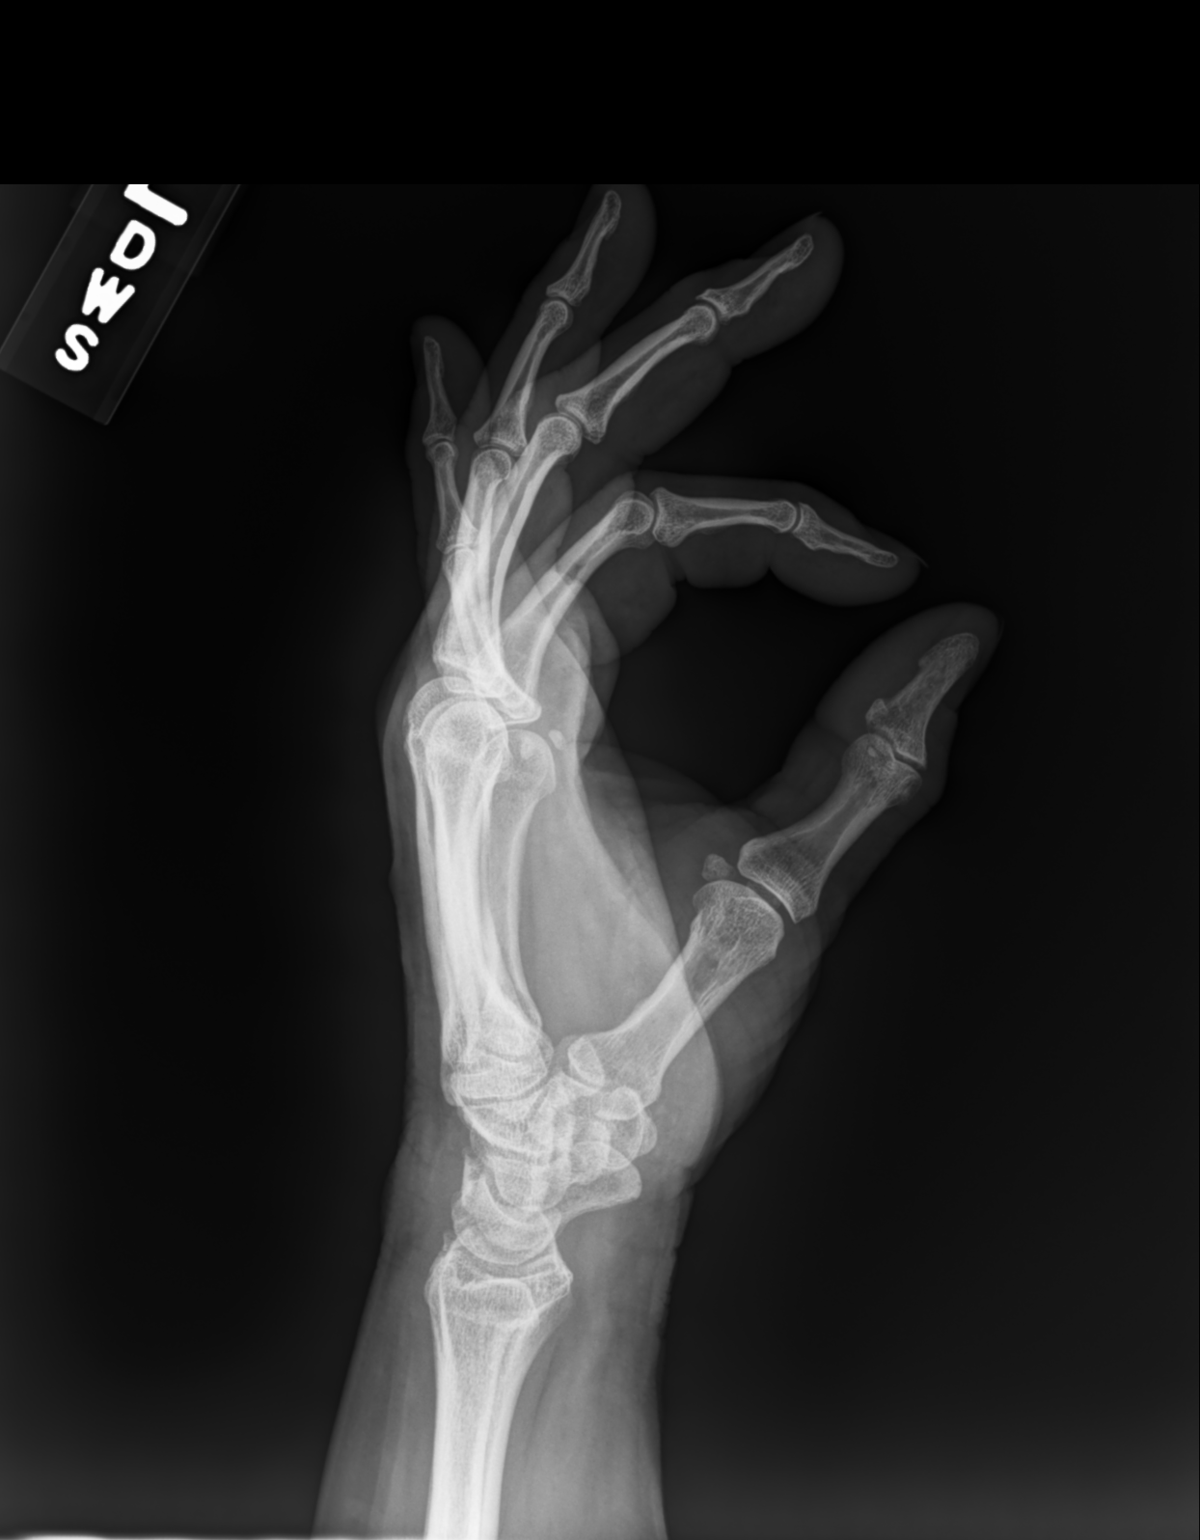

[3 of 3 positions shown; findings below may reference images not displayed]

FINDINGS: There is no evidence of fracture or dislocation. There is no
evidence of arthropathy or other focal bone abnormality. Soft
tissues are unremarkable.
IMPRESSION: Negative.

## 2021-09-09 ENCOUNTER — Other Ambulatory Visit: Payer: Self-pay

## 2021-09-09 ENCOUNTER — Emergency Department (HOSPITAL_COMMUNITY)
Admission: EM | Admit: 2021-09-09 | Discharge: 2021-09-09 | Disposition: A | Discharge status: Home / Self Care | Payer: BC Managed Care – PPO | Attending: Emergency Medicine | Admitting: Emergency Medicine

## 2021-09-09 ENCOUNTER — Encounter (HOSPITAL_COMMUNITY): Payer: Self-pay

## 2021-09-09 DIAGNOSIS — H5712 Ocular pain, left eye: Secondary | ICD-10-CM | POA: Diagnosis present

## 2021-09-09 DIAGNOSIS — H1032 Unspecified acute conjunctivitis, left eye: Secondary | ICD-10-CM | POA: Insufficient documentation

## 2021-09-09 DIAGNOSIS — L03213 Periorbital cellulitis: Secondary | ICD-10-CM | POA: Insufficient documentation

## 2021-09-09 MED ORDER — AMOXICILLIN-POT CLAVULANATE 875-125 MG PO TABS: 1.0000 | ORAL_TABLET | Freq: Two times a day (BID) | ORAL | 0 refills | Status: AC

## 2021-09-09 NOTE — ED Provider Notes (Signed)
?[facility]-EMERGENCY DEPT ?Provider Note ? ? ?CSN: [id number] ?Arrival date & time: 09/09/21  1901 ? ?  ? ?History ? ?Chief Complaint  ?Patient presents with  ? Facial Swelling  ? ? ?Patricia Harding is a 56 y.o. female.  Patient presents with pain and swelling in the orbit around the left eye.  Patient states that yesterday she had some discharge from her eye.  Today she woke up with swelling and pain in the exterior of the orbit.  The patient denies any trauma to her eye.  Patient denies any vision changes.  Endorses mild discharge from left eye.  Endorses tenderness around left eye. ? ?HPI ? ?  ? ?Home Medications ?Prior to Admission medications   ?Medication Sig Start Date End Date Taking? Authorizing Provider  ?amoxicillin-clavulanate (AUGMENTIN) 875-125 MG tablet Take 1 tablet by mouth every 12 (twelve) hours. 09/09/21  Yes Darrick Grinder, PA-C  ?albuterol (VENTOLIN HFA) 108 (90 Base) MCG/ACT inhaler Inhale into the lungs every 6 (six) hours as needed for wheezing or shortness of breath.    [provider]  ?amLODipine (NORVASC) 5 MG tablet Take 5 mg by mouth daily.    [provider]  ?ARIPiprazole (ABILIFY) 5 MG tablet Take 5 mg by mouth daily.    [provider]  ?diclofenac sodium (VOLTAREN) 1 % GEL Apply 4 g topically 4 (four) times daily. Please instruct in dosing. 09/30/14   Linna Hoff, MD  ?EPINEPHrine 0.3 mg/0.3 mL IJ SOAJ injection Inject 0.3 mg into the muscle as needed for anaphylaxis.    [provider]  ?fluticasone (FLONASE) 50 MCG/ACT nasal spray Place 2 sprays into both nostrils daily. 01/20/14   Hess, Nada Boozer, PA-C  ?gabapentin (NEURONTIN) 400 MG capsule Take 400 mg by mouth 3 (three) times daily.    [provider]  ?HYDROcodone-acetaminophen (NORCO/VICODIN) 5-325 MG per tablet Take 1-2 tablets by mouth every 6 (six) hours as needed. 10/18/13   Santiago Glad, PA-C  ?lamoTRIgine (LAMICTAL) 200 MG tablet Take 300 mg by mouth  daily. Take 1.5 tablet  (300 mg daily)    [provider]  ?losartan (COZAAR) 100 MG tablet Take 100 mg by mouth daily.    [provider]  ?metFORMIN (GLUCOPHAGE) 500 MG tablet Take by mouth 3 (three) times daily.    [provider]  ?montelukast (SINGULAIR) 10 MG tablet Take 10 mg by mouth at bedtime.    [provider]  ?PARoxetine (PAXIL) 40 MG tablet Take 40 mg by mouth every morning.    [provider]  ?QUEtiapine (SEROQUEL) 100 MG tablet Take 100 mg by mouth at bedtime.    [provider]  ?rosuvastatin (CRESTOR) 40 MG tablet Take 40 mg by mouth daily.    [provider]  ?   ? ?Allergies    ?Aspirin and Iohexol   ? ?Review of Systems   ?Review of Systems  ?Constitutional:  Negative for fever.  ?Eyes:  Positive for discharge and redness. Negative for photophobia, pain and visual disturbance.  ?     Swelling around left eye  ?Respiratory:  Negative for shortness of breath.   ? ?Physical Exam ?Updated Vital Signs ?BP 122/84   Pulse 88   Temp 98.9 ?F (37.2 ?C) (Oral)   Resp 18   LMP 09/30/2014   SpO2 96%  ?Physical Exam ?Vitals and nursing note reviewed.  ?Constitutional:   ?   General: She is not in acute distress. ?HENT:  ?  Head: Normocephalic.  ?Eyes:  ?   General: Vision grossly intact. No visual field deficit. ?   Extraocular Movements: Extraocular movements intact.  ?   Comments: No diplopia, ophthalmoplegia, pain with extraocular movements, visual impairment, or proptosis ?Swelling in soft tissue around left eye  ?Neurological:  ?   Mental Status: She is alert.  ? ? ?ED Results / Procedures / Treatments   ?Labs ?(all labs ordered are listed, but only abnormal results are displayed) ?Labs Reviewed - No data to display ? ?EKG ?None ? ?Radiology ?No results found. ? ?Procedures ?Procedures  ? ? ?Medications Ordered in ED ?Medications - No data to display ? ?ED Course/ Medical Decision Making/ A&P ?  ?                        ?Medical  Decision Making ?Risk ?Prescription drug management. ? ? ?Presents with chief concern of swelling around left eye.  Differential includes preseptal cellulitis and orbital cellulitis along with others. ? ?She has no symptoms of orbital cellulitis including no diplopia, no hemiplegia, no pain with eye movement, no visual impairment, and no proptosis.  Her presentation seems consistent with preseptal cellulitis. ? ?I considered ordering a CT of the orbits with contrast but the patient has a contrast allergy. ? ?I believe it would be safe to discharge the patient home with antibiotic therapy for preseptal cellulitis with strict return precautions.  Patient will return if no improvement over the next 24 to 48 hours.  Would consider changing antibiotic at that time if necessary. ? ?The patient also has some symptoms in the left eye concerning for possible conjunctivitis.  Mild injection with watery discharge noted.  I discussed possible antibiotic eyedrops to cover potential bacterial conjunctivitis but patient was not interested at this time. ? ?Plan on discharge home with antibiotic therapy for preseptal cellulitis.  Patient given strict return precautions. ? ? ?Final Clinical Impression(s) / ED Diagnoses ?Final diagnoses:  ?Preseptal cellulitis of left eye  ?Acute conjunctivitis of left eye, unspecified acute conjunctivitis type  ? ? ?Rx / DC Orders ?ED Discharge Orders   ? ?      Ordered  ?  amoxicillin-clavulanate (AUGMENTIN) 875-125 MG tablet  Every 12 hours       ? 09/09/21 2114  ? ?  ?  ? ?  ? ? ?  ?Darrick Grinder, PA-C ?09/09/21 2124 ? ?  ?Gerhard Munch, MD ?09/09/21 2320 ? ?

## 2021-09-09 NOTE — ED Provider Triage Note (Signed)
Emergency Medicine Provider Triage Evaluation Note ? ?Patricia Harding , a 56 y.o. female  was evaluated in triage.  Pt complains of pain and swelling to the left eye.  States her symptoms started about 2 days ago.  Yesterday she noticed that the left eye was mildly injected but otherwise had no symptoms.  This morning she woke up with worsening injection, yellow discharge from her left eye, as well as swelling and redness to the periorbital region.  Denies any fevers or chills but does note an episode of vomiting this morning.  Denies any visual changes.  Denies any pain with extraocular movements.  She does not wear contact lenses. ? ?Physical Exam  ?BP (!) 146/106   Pulse (!) 117   Temp 98.9 ?F (37.2 ?C) (Oral)   Resp 17   LMP 09/30/2014   SpO2 96%  ?Gen:   Awake, no distress   ?Resp:  Normal effort  ?MSK:   Moves extremities without difficulty  ?Other:   ? ?Medical Decision Making  ?Medically screening exam initiated at 7:43 PM.  Appropriate orders placed.  Patricia Harding was informed that the remainder of the evaluation will be completed by another provider, this initial triage assessment does not replace that evaluation, and the importance of remaining in the ED until their evaluation is complete. ?  ?Patricia Sou, PA-C ?09/09/21 1944 ? ?

## 2021-09-09 NOTE — Discharge Instructions (Signed)
You were today with preseptal cellulitis of the left eye.  I have ordered antibiotics for you.  If you see no improvement in 24 to 48 hours you should return for further evaluation and management.  Your left eye has symptoms consistent with viral conjunctivitis.  This is highly contagious.  Be sure to practice good hand hygiene and to use separate washcloth and warm compresses on your left eye. ?

## 2021-09-09 NOTE — ED Triage Notes (Signed)
Patient stated yesterday she woke up and her eye was a little swollen. Then today it got a lot more red and swollen. Patient said it hurts and itches and yellow/green gunk is coming out of it. She has been taking allegra and flonase. Stating her throat is now sore like an allergy feeling. Vision is the same in both eyes. Eye is red, puffy underneath.  ?
